# Patient Record
Sex: Female | Born: 1955 | Race: White | Hispanic: No | Marital: Married
Health system: Midwestern US, Community
[De-identification: ages and names within clinical notes are randomized; demographics above are authoritative.]

## PROBLEM LIST (undated history)

## (undated) DIAGNOSIS — G4730 Sleep apnea, unspecified: Secondary | ICD-10-CM

## (undated) DIAGNOSIS — E785 Hyperlipidemia, unspecified: Secondary | ICD-10-CM

## (undated) HISTORY — PX: POLYPECTOMY: SHX149

## (undated) HISTORY — PX: WISDOM TOOTH EXTRACTION: SHX21

## (undated) HISTORY — PX: COLONOSCOPY: SHX174

## (undated) HISTORY — DX: Hyperlipidemia, unspecified: E78.5

## (undated) HISTORY — DX: Sleep apnea, unspecified: G47.30

## (undated) NOTE — Progress Notes (Signed)
 Formatting of this note is different from the original. Subjective:    Patient ID: Teresa Huff is a 33 y.o. female.  HPI [name] presents with sore throat, chills and sweats, for 2 days.  Minimal nasal drip, though no sinus pain or cough noted.  No myalgias.  Not much fatigue.  No rash.  Review of Systems   GI neg Gu neg   Objective:   Physical Exam Pleasant WF, alert, in NAD Nostrils mildly red, moist No sinus tenderness Throat red, no pus Minimal ant cervical LN's Lungs clear RRR No rash  Assessment:   Pharyngitis  Plan:    Strep screen negative Antibiotic, ibuprofen    The history section was last reviewed by Teresa DELENA Salmon, MD on Sep 04, 2011. Electronically signed by Teresa DELENA Salmon, MD at 09/04/2011  3:20 PM EDT

## (undated) NOTE — Progress Notes (Signed)
 Formatting of this note is different from the original. Subjective:    Patient ID: Teresa Huff is a 89 y.o. female.  HPI  Awoke mattery red left eye. Husband had pink eye. On meds and better. Had matter. Irritated. No pain.  Mild watery. No contacts. Nothing gotten in eye.  No cold. Ampicillin last week for throat and is better.  No medical problems.  No allergies to meds.   Review of Systems  All other systems reviewed and are negative.     Objective:   Physical Exam  Constitutional: She appears well-developed and well-nourished.  HENT:  Head: Normocephalic and atraumatic.  Right Ear: External ear normal.  Left Ear: External ear normal.  Nose: Nose normal.       Mild residual pharyngitis  Eyes: Right eye exhibits no discharge. Left eye exhibits discharge.       Cornea normal. Mild conjunctivitis.  Neck: Normal range of motion. Neck supple.  Cardiovascular: Normal rate, regular rhythm and normal heart sounds.   Pulmonary/Chest: Effort normal and breath sounds normal.    Assessment:    conjunctivitis   Plan:   Sodium sulamyd opth   The history section was last reviewed by Toribio FORBES Ada, MA on Sep 07, 2011. Electronically signed by Particia Capri, MD at 09/07/2011  9:03 AM EDT

---

## 1999-05-23 ENCOUNTER — Encounter: Admission: RE | Admit: 1999-05-23 | Discharge: 1999-05-23 | Payer: Self-pay | Admitting: Internal Medicine

## 1999-05-23 ENCOUNTER — Encounter: Payer: Self-pay | Admitting: Internal Medicine

## 1999-06-02 ENCOUNTER — Encounter: Payer: Self-pay | Admitting: Internal Medicine

## 1999-06-02 ENCOUNTER — Encounter: Admission: RE | Admit: 1999-06-02 | Discharge: 1999-06-02 | Payer: Self-pay | Admitting: Internal Medicine

## 2000-07-13 ENCOUNTER — Encounter: Payer: Self-pay | Admitting: Internal Medicine

## 2000-07-13 ENCOUNTER — Encounter: Admission: RE | Admit: 2000-07-13 | Discharge: 2000-07-13 | Payer: Self-pay | Admitting: Internal Medicine

## 2001-05-27 ENCOUNTER — Encounter: Payer: Self-pay | Admitting: Internal Medicine

## 2001-05-27 ENCOUNTER — Encounter: Admission: RE | Admit: 2001-05-27 | Discharge: 2001-05-27 | Payer: Self-pay | Admitting: Internal Medicine

## 2001-07-14 ENCOUNTER — Encounter: Payer: Self-pay | Admitting: Internal Medicine

## 2001-07-14 ENCOUNTER — Encounter: Admission: RE | Admit: 2001-07-14 | Discharge: 2001-07-14 | Payer: Self-pay | Admitting: Internal Medicine

## 2002-07-17 ENCOUNTER — Encounter: Admission: RE | Admit: 2002-07-17 | Discharge: 2002-07-17 | Payer: Self-pay | Admitting: Internal Medicine

## 2002-07-17 ENCOUNTER — Encounter: Payer: Self-pay | Admitting: Internal Medicine

## 2003-07-20 ENCOUNTER — Encounter: Admission: RE | Admit: 2003-07-20 | Discharge: 2003-07-20 | Payer: Self-pay | Admitting: Internal Medicine

## 2004-10-01 ENCOUNTER — Encounter: Admission: RE | Admit: 2004-10-01 | Discharge: 2004-10-01 | Payer: Self-pay | Admitting: Internal Medicine

## 2005-05-18 ENCOUNTER — Encounter: Admission: RE | Admit: 2005-05-18 | Discharge: 2005-05-18 | Payer: Self-pay | Admitting: Internal Medicine

## 2005-10-06 ENCOUNTER — Encounter: Admission: RE | Admit: 2005-10-06 | Discharge: 2005-10-06 | Payer: Self-pay | Admitting: Internal Medicine

## 2006-05-17 ENCOUNTER — Encounter: Admission: RE | Admit: 2006-05-17 | Discharge: 2006-05-17 | Payer: Self-pay | Admitting: Internal Medicine

## 2006-10-11 ENCOUNTER — Encounter: Admission: RE | Admit: 2006-10-11 | Discharge: 2006-10-11 | Payer: Self-pay | Admitting: Internal Medicine

## 2007-08-16 ENCOUNTER — Ambulatory Visit: Payer: Self-pay | Admitting: Internal Medicine

## 2007-08-22 ENCOUNTER — Telehealth (INDEPENDENT_AMBULATORY_CARE_PROVIDER_SITE_OTHER): Payer: Self-pay | Admitting: *Deleted

## 2007-08-23 ENCOUNTER — Ambulatory Visit: Payer: Self-pay | Admitting: Internal Medicine

## 2007-10-17 ENCOUNTER — Encounter: Admission: RE | Admit: 2007-10-17 | Discharge: 2007-10-17 | Payer: Self-pay | Admitting: Internal Medicine

## 2008-10-17 ENCOUNTER — Encounter: Admission: RE | Admit: 2008-10-17 | Discharge: 2008-10-17 | Payer: Self-pay | Admitting: Internal Medicine

## 2009-10-23 ENCOUNTER — Encounter: Admission: RE | Admit: 2009-10-23 | Discharge: 2009-10-23 | Payer: Self-pay | Admitting: Internal Medicine

## 2010-10-23 ENCOUNTER — Other Ambulatory Visit: Payer: Self-pay | Admitting: Internal Medicine

## 2010-10-23 DIAGNOSIS — Z1231 Encounter for screening mammogram for malignant neoplasm of breast: Secondary | ICD-10-CM

## 2010-11-05 ENCOUNTER — Ambulatory Visit: Payer: Self-pay

## 2010-11-06 ENCOUNTER — Ambulatory Visit: Payer: Self-pay

## 2010-11-11 ENCOUNTER — Ambulatory Visit
Admission: RE | Admit: 2010-11-11 | Discharge: 2010-11-11 | Disposition: A | Discharge status: Home / Self Care | Payer: BC Managed Care – PPO | Source: Ambulatory Visit | Attending: Internal Medicine | Admitting: Internal Medicine

## 2010-11-11 DIAGNOSIS — Z1231 Encounter for screening mammogram for malignant neoplasm of breast: Secondary | ICD-10-CM

## 2010-11-14 ENCOUNTER — Other Ambulatory Visit: Payer: Self-pay | Admitting: Internal Medicine

## 2010-11-14 DIAGNOSIS — R928 Other abnormal and inconclusive findings on diagnostic imaging of breast: Secondary | ICD-10-CM

## 2010-11-20 ENCOUNTER — Ambulatory Visit
Admission: RE | Admit: 2010-11-20 | Discharge: 2010-11-20 | Disposition: A | Discharge status: Home / Self Care | Payer: BC Managed Care – PPO | Source: Ambulatory Visit | Attending: Internal Medicine | Admitting: Internal Medicine

## 2010-11-20 DIAGNOSIS — R928 Other abnormal and inconclusive findings on diagnostic imaging of breast: Secondary | ICD-10-CM

## 2010-11-26 ENCOUNTER — Other Ambulatory Visit: Payer: BC Managed Care – PPO

## 2011-12-07 ENCOUNTER — Other Ambulatory Visit: Payer: Self-pay | Admitting: Internal Medicine

## 2011-12-07 DIAGNOSIS — Z1231 Encounter for screening mammogram for malignant neoplasm of breast: Secondary | ICD-10-CM

## 2011-12-14 ENCOUNTER — Ambulatory Visit
Admission: RE | Admit: 2011-12-14 | Discharge: 2011-12-14 | Disposition: A | Discharge status: Home / Self Care | Payer: BC Managed Care – PPO | Source: Ambulatory Visit | Attending: Internal Medicine | Admitting: Internal Medicine

## 2011-12-14 DIAGNOSIS — Z1231 Encounter for screening mammogram for malignant neoplasm of breast: Secondary | ICD-10-CM

## 2012-07-02 IMAGING — MG MM DIGITAL DIAG LTD L {BCG}
2 series · 2 of 2 positions shown · non-contrast
Comparison: Recent and prior mammograms from the [REDACTED]

CLINICAL DATA: Abnormal screening mammogram with possible left
breast distortion.

DIGITAL DIAGNOSTIC LEFT MAMMOGRAM

[L CC]
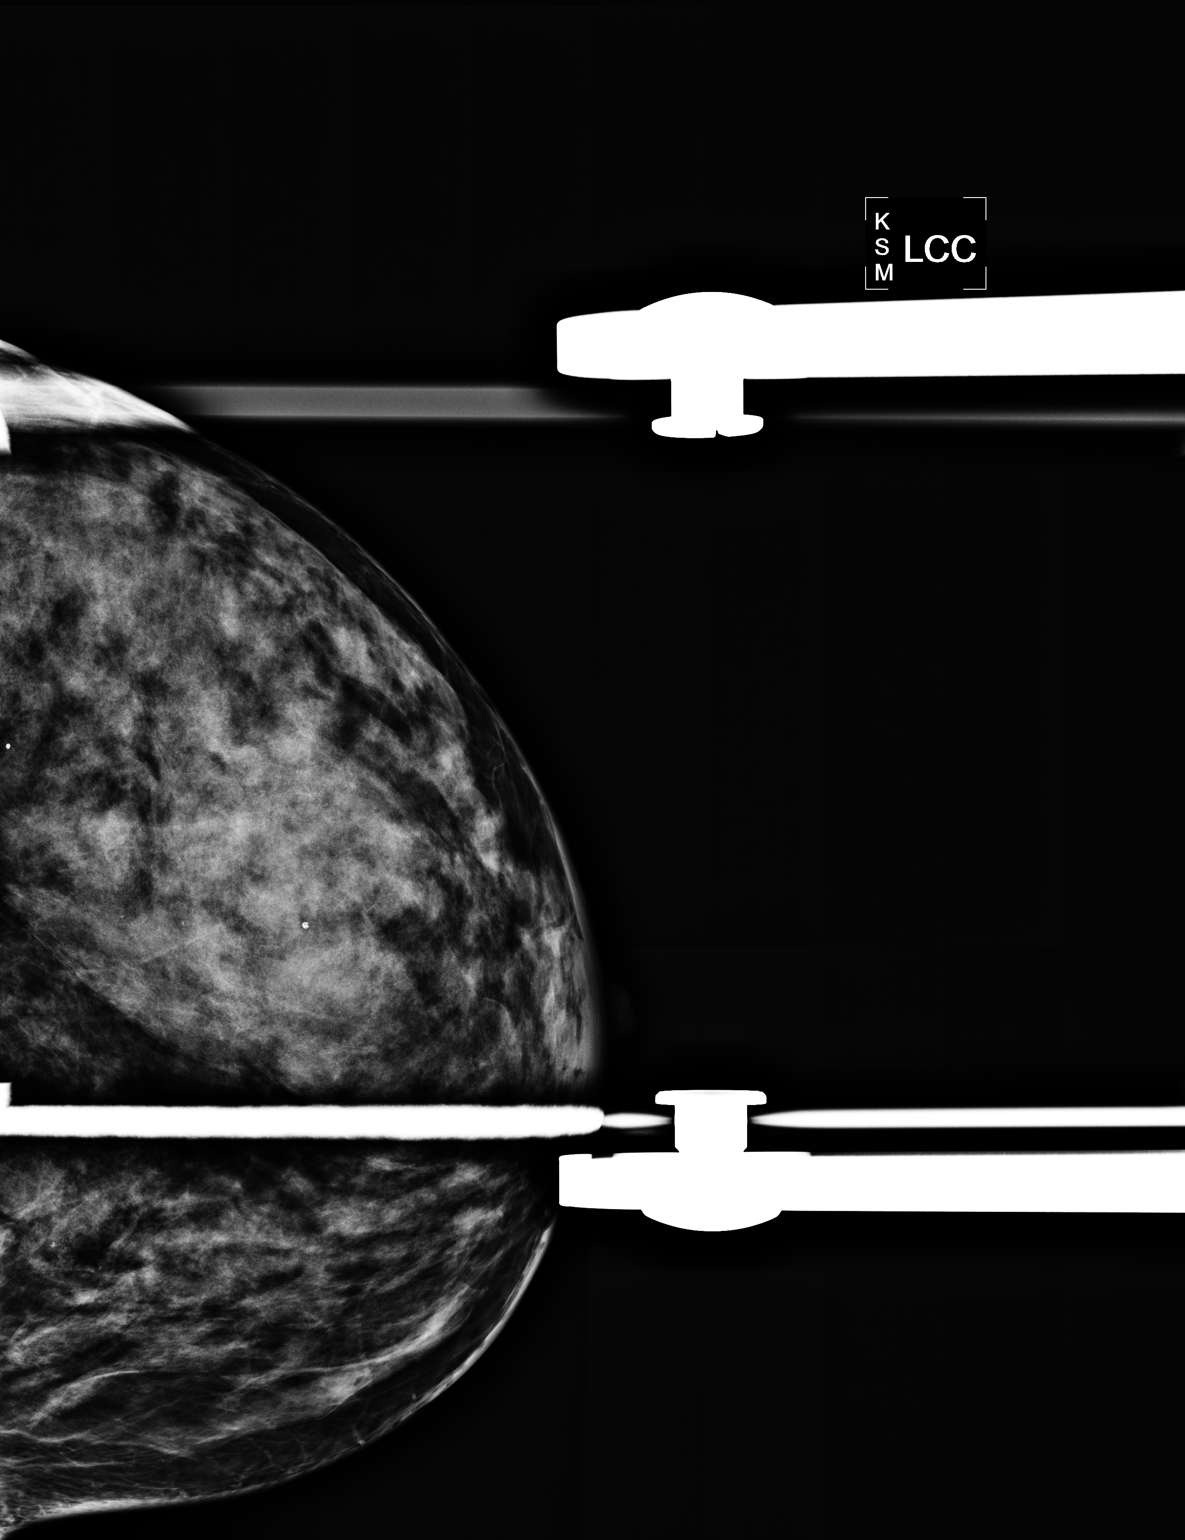

[L MLO]
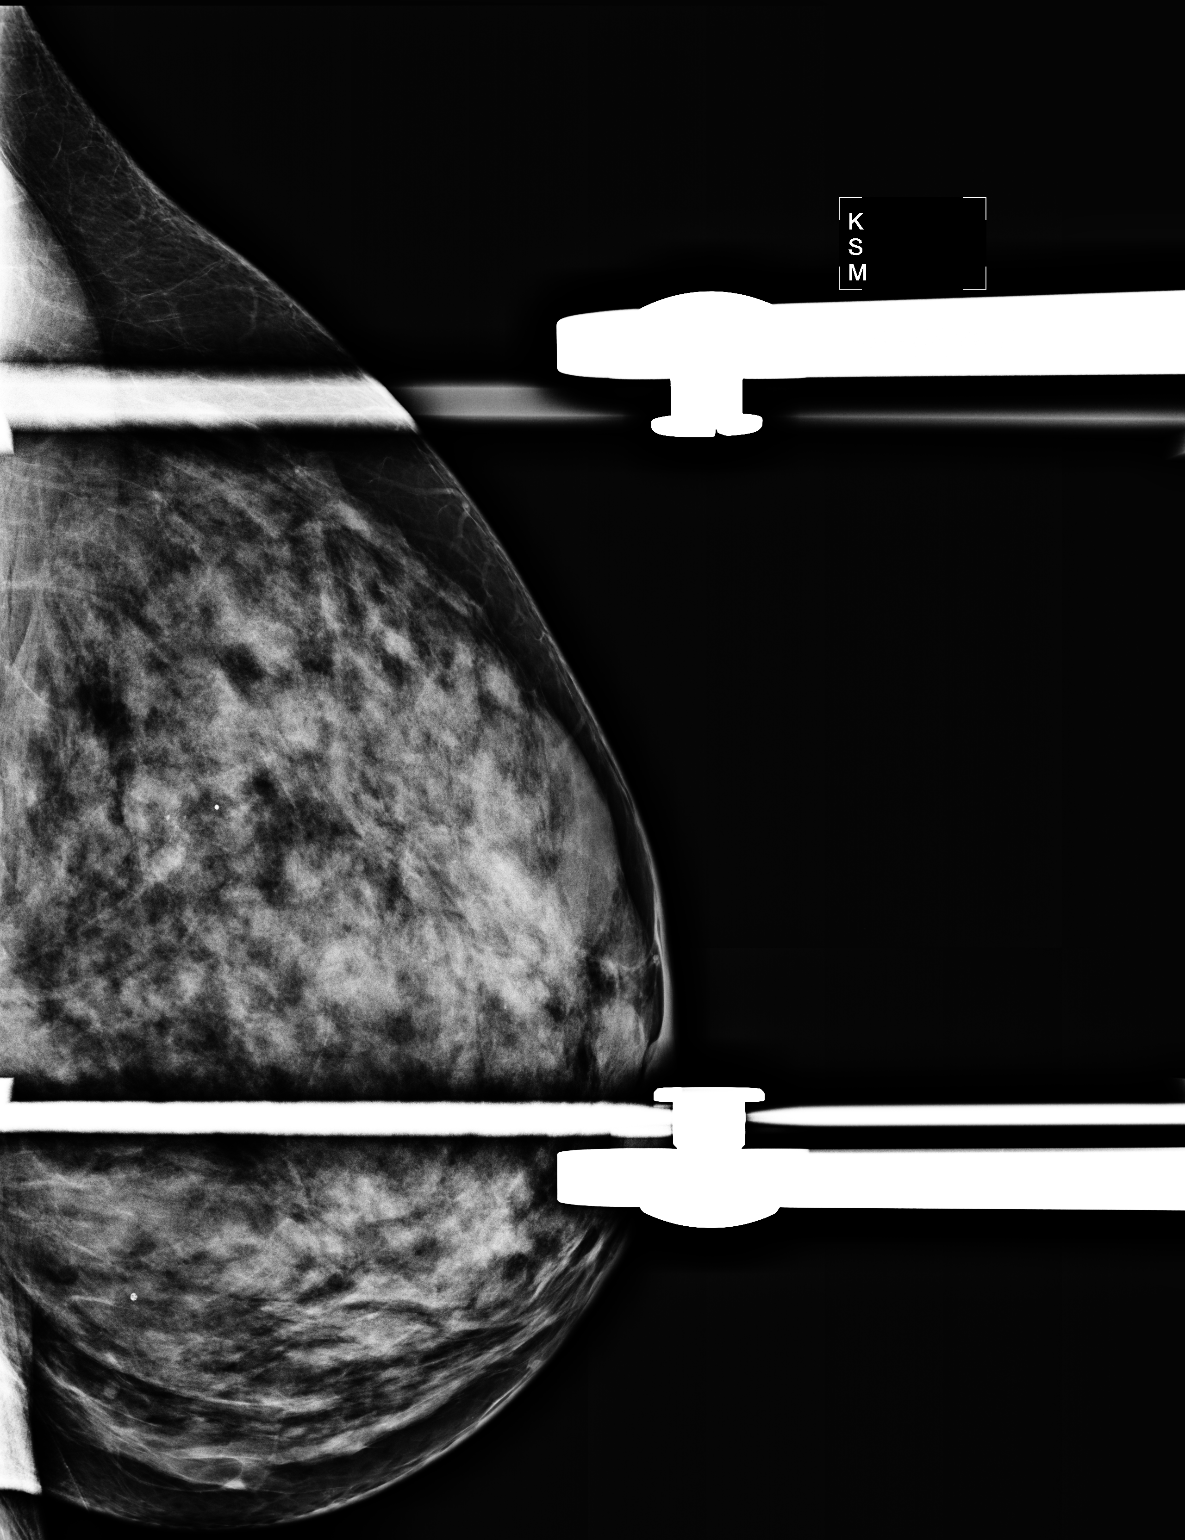

[2 of 2 positions shown; findings below may reference images not displayed]

FINDINGS: CC and MLO spot compression views of the left breast are
performed in the area of the screening study finding. There is no
evidence of persistent mass, distortion, or suspicious
calcifications.
IMPRESSION: No mammographic evidence of malignancy or abnormality, specifically
in the area of the screening study finding.

These findings were discussed with the patient.  She was encouraged
to begin/continue monthly self exams and to contact her primary
physician if any changes noted.

BI-RADS CATEGORY 1:  Negative.

Recommend bilateral screening mammograms in 1 year.

## 2012-10-05 ENCOUNTER — Encounter: Payer: Self-pay | Admitting: Internal Medicine

## 2012-10-17 ENCOUNTER — Encounter: Payer: Self-pay | Admitting: Internal Medicine

## 2012-11-16 ENCOUNTER — Other Ambulatory Visit: Payer: Self-pay

## 2012-11-16 DIAGNOSIS — Z1231 Encounter for screening mammogram for malignant neoplasm of breast: Secondary | ICD-10-CM

## 2012-12-14 ENCOUNTER — Ambulatory Visit
Admission: RE | Admit: 2012-12-14 | Discharge: 2012-12-14 | Disposition: A | Discharge status: Home / Self Care | Payer: BC Managed Care – PPO | Source: Ambulatory Visit

## 2012-12-14 DIAGNOSIS — Z1231 Encounter for screening mammogram for malignant neoplasm of breast: Secondary | ICD-10-CM

## 2012-12-27 ENCOUNTER — Ambulatory Visit (AMBULATORY_SURGERY_CENTER): Payer: Self-pay

## 2012-12-27 VITALS — Ht 66.0 in | Wt 132.2 lb

## 2012-12-27 DIAGNOSIS — Z800 Family history of malignant neoplasm of digestive organs: Secondary | ICD-10-CM

## 2012-12-27 DIAGNOSIS — Z860100 Personal history of colon polyps, unspecified: Secondary | ICD-10-CM

## 2012-12-27 MED ORDER — MOVIPREP 100 G PO SOLR: ORAL | Status: DC

## 2012-12-28 ENCOUNTER — Encounter: Payer: Self-pay | Admitting: Internal Medicine

## 2013-01-10 ENCOUNTER — Encounter: Payer: Self-pay | Admitting: Internal Medicine

## 2013-01-10 ENCOUNTER — Ambulatory Visit (AMBULATORY_SURGERY_CENTER): Payer: BC Managed Care – PPO | Admitting: Internal Medicine

## 2013-01-10 VITALS — BP 132/79 | HR 55 | Temp 97.3°F | Resp 27 | Ht 66.0 in | Wt 132.0 lb

## 2013-01-10 DIAGNOSIS — Z860100 Personal history of colon polyps, unspecified: Secondary | ICD-10-CM

## 2013-01-10 DIAGNOSIS — Z1211 Encounter for screening for malignant neoplasm of colon: Secondary | ICD-10-CM

## 2013-01-10 DIAGNOSIS — Z800 Family history of malignant neoplasm of digestive organs: Secondary | ICD-10-CM

## 2013-01-10 MED ORDER — SODIUM CHLORIDE 0.9 % IV SOLN: 500.0000 mL | INTRAVENOUS | Status: DC

## 2013-01-10 NOTE — Progress Notes (Signed)
Patient did not experience any of the following events: a burn prior to discharge; a fall within the facility; wrong site/side/patient/procedure/implant event; or a hospital transfer or hospital admission upon discharge from the facility. (G8907) Patient did not have preoperative order for IV antibiotic SSI prophylaxis. (G8918)  

## 2013-01-10 NOTE — Progress Notes (Signed)
A/ox3 pleased with MAC, report to Jane RN 

## 2013-01-10 NOTE — Patient Instructions (Signed)

## 2013-01-10 NOTE — Progress Notes (Signed)
NO EGG OR SOY ALLERGY. EWM 

## 2013-01-10 NOTE — Op Note (Signed)
[facility] [address] [place], [address]   COLONOSCOPY PROCEDURE REPORT  PATIENT: Teresa Huff, Teresa Huff  MR#: [id number] BIRTHDATE: 1955/06/21 , 56  yrs. old GENDER: Female ENDOSCOPIST: Roxy Cedar, MD REFERRED [id number] Program Recall PROCEDURE DATE:  01/10/2013 PROCEDURE:   Colonoscopy, surveillance First Screening Colonoscopy - Avg.  risk and is 50 yrs.  old or older - No.  Prior Negative Screening - Now for repeat screening. N/A  History of Adenoma - Now for follow-up colonoscopy & has been > or = to 3 yrs.  Yes hx of adenoma.  Has been 3 or more years since last colonoscopy.  Polyps Removed Today? No.  Recommend repeat exam, <10 yrs? No. ASA CLASS:   Class II INDICATIONS:elevated risk screening, Patient's family history of colon cancer (GP), and Patient's personal history of adenomatous colon polyps.   Prior exams 1992,95,99,2004,09. No advanced neoplasia MEDICATIONS: MAC sedation, administered by CRNA and propofol (Diprivan) 340mg  IV  DESCRIPTION OF PROCEDURE:   After the risks benefits and alternatives of the procedure were thoroughly explained, informed consent was obtained.  A digital rectal exam revealed no abnormalities of the rectum.   The LB [id number]  endoscope was introduced through the anus and advanced to the cecum, which was identified by both the appendix and ileocecal valve. No adverse events experienced.   The quality of the prep was excellent, using MoviPrep  The instrument was then slowly withdrawn as the colon was fully examined.  COLON FINDINGS: Moderate diverticulosis was noted in the sigmoid colon.   A normal appearing cecum, ileocecal valve, and appendiceal orifice were identified.  The ascending, hepatic flexure, transverse, splenic flexure, descending, sigmoid colon and rectum appeared unremarkable.  No polyps or cancers were seen. Retroflexed views revealed no abnormalities. The time to cecum=4 minutes 49  seconds.  Withdrawal time=13 minutes 37 seconds.  The scope was withdrawn and the procedure completed. COMPLICATIONS: There were no complications.  ENDOSCOPIC IMPRESSION: 1.   Moderate diverticulosis was noted in the sigmoid colon 2.   Otherwise Normal colon  RECOMMENDATIONS: 1. Continue current colorectal screening/surveillance recommendations with a repeat colonoscopy in 10 years.   eSigned:  Roxy Cedar, MD 01/10/2013 9:17 AM   cc: Burton Apley, MD and The Patient

## 2013-01-11 ENCOUNTER — Telehealth: Payer: Self-pay

## 2013-01-11 NOTE — Telephone Encounter (Signed)
  Follow up Call-  Call back number 01/10/2013  Post procedure Call Back phone  # [phone]  Permission to leave phone message Yes     Patient questions:  Do you have a fever, pain , or abdominal swelling? no Pain Score  0 *  Have you tolerated food without any problems? yes  Have you been able to return to your normal activities? yes  Do you have any questions about your discharge instructions: Diet   no Medications  no Follow up visit  no  Do you have questions or concerns about your Care? no  Actions: * If pain score is 4 or above: No action needed, pain <4.

## 2013-11-16 ENCOUNTER — Other Ambulatory Visit: Payer: Self-pay | Admitting: Internal Medicine

## 2013-11-16 ENCOUNTER — Other Ambulatory Visit: Payer: Self-pay

## 2013-11-16 DIAGNOSIS — Z1231 Encounter for screening mammogram for malignant neoplasm of breast: Secondary | ICD-10-CM

## 2013-11-16 DIAGNOSIS — Z780 Asymptomatic menopausal state: Secondary | ICD-10-CM

## 2013-11-16 DIAGNOSIS — E2839 Other primary ovarian failure: Secondary | ICD-10-CM

## 2013-12-27 ENCOUNTER — Ambulatory Visit
Admission: RE | Admit: 2013-12-27 | Discharge: 2013-12-27 | Disposition: A | Discharge status: Home / Self Care | Payer: BC Managed Care – PPO | Source: Ambulatory Visit | Attending: Internal Medicine | Admitting: Internal Medicine

## 2013-12-27 ENCOUNTER — Ambulatory Visit
Admission: RE | Admit: 2013-12-27 | Discharge: 2013-12-27 | Disposition: A | Discharge status: Home / Self Care | Payer: BC Managed Care – PPO | Source: Ambulatory Visit

## 2013-12-27 DIAGNOSIS — Z780 Asymptomatic menopausal state: Secondary | ICD-10-CM

## 2013-12-27 DIAGNOSIS — Z1231 Encounter for screening mammogram for malignant neoplasm of breast: Secondary | ICD-10-CM

## 2013-12-27 DIAGNOSIS — E2839 Other primary ovarian failure: Secondary | ICD-10-CM

## 2014-01-04 ENCOUNTER — Encounter: Payer: Self-pay | Admitting: Internal Medicine

## 2014-11-27 ENCOUNTER — Other Ambulatory Visit: Payer: Self-pay

## 2014-11-27 DIAGNOSIS — Z1231 Encounter for screening mammogram for malignant neoplasm of breast: Secondary | ICD-10-CM

## 2015-01-01 ENCOUNTER — Ambulatory Visit
Admission: RE | Admit: 2015-01-01 | Discharge: 2015-01-01 | Disposition: A | Discharge status: Home / Self Care | Payer: PRIVATE HEALTH INSURANCE | Source: Ambulatory Visit

## 2015-01-01 DIAGNOSIS — Z1231 Encounter for screening mammogram for malignant neoplasm of breast: Secondary | ICD-10-CM

## 2015-06-25 ENCOUNTER — Ambulatory Visit
Admission: RE | Admit: 2015-06-25 | Discharge: 2015-06-25 | Disposition: A | Discharge status: Home / Self Care | Payer: PRIVATE HEALTH INSURANCE | Source: Ambulatory Visit | Attending: Internal Medicine | Admitting: Internal Medicine

## 2015-06-25 ENCOUNTER — Other Ambulatory Visit: Payer: Self-pay | Admitting: Internal Medicine

## 2015-06-25 DIAGNOSIS — R05 Cough: Secondary | ICD-10-CM

## 2015-06-25 DIAGNOSIS — R059 Cough, unspecified: Secondary | ICD-10-CM

## 2015-11-26 ENCOUNTER — Other Ambulatory Visit: Payer: Self-pay | Admitting: Internal Medicine

## 2015-11-26 DIAGNOSIS — Z1231 Encounter for screening mammogram for malignant neoplasm of breast: Secondary | ICD-10-CM

## 2016-01-02 ENCOUNTER — Ambulatory Visit
Admission: RE | Admit: 2016-01-02 | Discharge: 2016-01-02 | Disposition: A | Discharge status: Home / Self Care | Payer: 59 | Source: Ambulatory Visit | Attending: Internal Medicine | Admitting: Internal Medicine

## 2016-01-02 DIAGNOSIS — Z1231 Encounter for screening mammogram for malignant neoplasm of breast: Secondary | ICD-10-CM

## 2016-11-26 ENCOUNTER — Other Ambulatory Visit: Payer: Self-pay | Admitting: Internal Medicine

## 2016-11-26 DIAGNOSIS — Z1231 Encounter for screening mammogram for malignant neoplasm of breast: Secondary | ICD-10-CM

## 2017-01-06 ENCOUNTER — Ambulatory Visit
Admission: RE | Admit: 2017-01-06 | Discharge: 2017-01-06 | Disposition: A | Discharge status: Home / Self Care | Payer: 59 | Source: Ambulatory Visit | Attending: Internal Medicine | Admitting: Internal Medicine

## 2017-01-06 DIAGNOSIS — Z1231 Encounter for screening mammogram for malignant neoplasm of breast: Secondary | ICD-10-CM

## 2017-05-29 ENCOUNTER — Ambulatory Visit: Payer: Self-pay | Admitting: Emergency Medicine

## 2017-05-29 VITALS — BP 124/78 | HR 82 | Temp 98.6°F | Resp 18

## 2017-05-29 DIAGNOSIS — J40 Bronchitis, not specified as acute or chronic: Secondary | ICD-10-CM

## 2017-05-29 MED ORDER — BENZONATATE 100 MG PO CAPS: 100.0000 mg | ORAL_CAPSULE | Freq: Three times a day (TID) | ORAL | 0 refills | Status: DC | PRN

## 2017-05-29 MED ORDER — AZITHROMYCIN 250 MG PO TABS
ORAL_TABLET | ORAL | 0 refills | Status: DC
Start: 2017-05-29 — End: 2023-03-03

## 2017-05-29 NOTE — Progress Notes (Signed)
  Patient presents for evaluation of nonproductive cough. Symptoms began 7 days ago and are gradually worsening since that time. Past history is significant for nothing. Denies congestion, fever, myalgias, sore throat, or other symptoms. Does not smoke, not currently taking any OTC medications.  Review of Systems  Constitutional: Negative for chills, fever and malaise/fatigue.  HENT: Negative.   Respiratory: Positive for cough. Negative for shortness of breath and wheezing.   Cardiovascular: Negative.   Gastrointestinal: Negative.   Musculoskeletal: Negative.   Skin: Negative.   Neurological: Negative.    O: Vitals:   05/29/17 1343  BP: 124/78  Pulse: 82  Resp: 18  Temp: 98.6 F (37 C)  SpO2: 97%    Physical Exam  Constitutional: She appears well-developed and well-nourished. No distress.  HENT:  Head: Normocephalic and atraumatic.  Right Ear: Tympanic membrane and external ear normal.  Left Ear: Tympanic membrane and external ear normal.  Nose: Nose normal. Right sinus exhibits no maxillary sinus tenderness and no frontal sinus tenderness. Left sinus exhibits no maxillary sinus tenderness and no frontal sinus tenderness.  Mouth/Throat: Uvula is midline.  Eyes: Conjunctivae are normal.  Cardiovascular: Normal rate and regular rhythm.  Pulmonary/Chest: Effort normal and breath sounds normal.  Lymphadenopathy:    She has no cervical adenopathy.  Neurological: She is alert.  Skin: Skin is warm and dry. Capillary refill takes less than 2 seconds. No rash noted. She is not diaphoretic. No erythema.  Psychiatric: She has a normal mood and affect. Her behavior is normal.  Nursing note and vitals reviewed.  A: 1. Bronchitis    P: 1. Bronchitis -Rest, OTC mucinex DM, follow up with PCP as needed - azithromycin (ZITHROMAX) 250 MG tablet; 2 tablets today, then 1 daily till finished  Dispense: 6 tablet; Refill: 0 - benzonatate (TESSALON PERLES) 100 MG capsule; Take 1 capsule (100  mg total) by mouth 3 (three) times daily as needed.  Dispense: 30 capsule; Refill: 0

## 2017-05-29 NOTE — Patient Instructions (Signed)

## 2017-06-01 ENCOUNTER — Telehealth: Payer: Self-pay

## 2017-06-01 NOTE — Telephone Encounter (Signed)
Called and spoke with pt to see how she was feeling and she states she is feeling much better.

## 2017-12-17 ENCOUNTER — Other Ambulatory Visit: Payer: Self-pay | Admitting: Internal Medicine

## 2017-12-17 DIAGNOSIS — Z1231 Encounter for screening mammogram for malignant neoplasm of breast: Secondary | ICD-10-CM

## 2018-01-05 ENCOUNTER — Ambulatory Visit: Payer: Self-pay | Admitting: Family Medicine

## 2018-01-05 ENCOUNTER — Encounter: Payer: Self-pay | Admitting: Family Medicine

## 2018-01-05 VITALS — BP 110/82 | HR 77 | Temp 98.9°F | Wt 135.4 lb

## 2018-01-05 DIAGNOSIS — R059 Cough, unspecified: Secondary | ICD-10-CM

## 2018-01-05 DIAGNOSIS — R05 Cough: Secondary | ICD-10-CM

## 2018-01-05 DIAGNOSIS — R062 Wheezing: Secondary | ICD-10-CM

## 2018-01-05 DIAGNOSIS — J069 Acute upper respiratory infection, unspecified: Secondary | ICD-10-CM

## 2018-01-05 MED ORDER — BENZONATATE 200 MG PO CAPS: 200.0000 mg | ORAL_CAPSULE | Freq: Three times a day (TID) | ORAL | 0 refills | Status: DC | PRN

## 2018-01-05 MED ORDER — AMOXICILLIN 875 MG PO TABS: 875.0000 mg | ORAL_TABLET | Freq: Two times a day (BID) | ORAL | 0 refills | Status: AC

## 2018-01-05 NOTE — Progress Notes (Signed)
Teresa Huff is a 62 y.o. female who presents today with concerns of cough for the last 7 days. She denies any sick contacts social or work related and reports that she has only taken Advil for her symptoms without much relief, she denies a known fever or chills but did not take her temperature. She denies any chronic health conditions or any regular daily medication use. She describes herself as generally healthy with occasional seasonal allergies that she will treat as needed with Allegra. She denies a history of GERD or Asthma.  Review of Systems  Constitutional: Negative for chills, fever and malaise/fatigue.  HENT: Positive for sinus pain. Negative for congestion, ear discharge, ear pain and sore throat.   Eyes: Negative.   Respiratory: Positive for cough. Negative for sputum production and shortness of breath.   Cardiovascular: Negative.  Negative for chest pain.  Gastrointestinal: Negative for abdominal pain, diarrhea, nausea and vomiting.  Genitourinary: Negative for dysuria, frequency, hematuria and urgency.  Musculoskeletal: Negative for myalgias.  Skin: Negative.   Neurological: Negative for headaches.  Endo/Heme/Allergies: Negative.   Psychiatric/Behavioral: Negative.     O: Vitals:   01/05/18 0826  BP: 110/82  Pulse: 77  Temp: 98.9 F (37.2 C)  SpO2: 95%     Physical Exam  Constitutional: She is oriented to person, place, and time. Vital signs are normal. She appears well-developed and well-nourished. She is active.  Non-toxic appearance. She does not have a sickly appearance.  HENT:  Head: Normocephalic.  Right Ear: Hearing, external ear and ear canal normal. A middle ear effusion is present.  Left Ear: Hearing, tympanic membrane, external ear and ear canal normal.  Nose: Rhinorrhea present. Right sinus exhibits no maxillary sinus tenderness and no frontal sinus tenderness. Left sinus exhibits no maxillary sinus tenderness and no frontal sinus tenderness.   Mouth/Throat: Uvula is midline and oropharynx is clear and moist.  Neck: Normal range of motion. Neck supple.  Cardiovascular: Normal rate, regular rhythm, normal heart sounds and normal pulses.  Pulmonary/Chest: Effort normal. She has wheezes in the right upper field. She has rhonchi in the right upper field, the right middle field, the right lower field, the left upper field, the left middle field and the left lower field.  Abdominal: Soft. Bowel sounds are normal.  Musculoskeletal: Normal range of motion.  Lymphadenopathy:       Head (right side): Tonsillar adenopathy present. No submental and no submandibular adenopathy present.       Head (left side): Tonsillar adenopathy present. No submental and no submandibular adenopathy present.    She has cervical adenopathy.  Neurological: She is alert and oriented to person, place, and time.  Psychiatric: She has a normal mood and affect.  Vitals reviewed.    A: 1. Cough   2. Wheezing   3. Upper respiratory tract infection, unspecified type      P: Discussed exam findings, diagnosis etiology and medication use and indications reviewed with patient. Follow- Up and discharge instructions provided. No emergent/urgent issues found on exam.  Patient verbalized understanding of information provided and agrees with plan of care (POC), all questions answered.  Selected medication that would treat UTI and sinusitis like symptoms- noting patient's allergies to UTI drug of choice Macrobid- due to suspected local Septra resistance.  PLAN< Over the counter Mucinex-D Increasing hydration and good cough hygiene Take medication prescribed as directed  1. Upper respiratory tract infection, unspecified type - amoxicillin (AMOXIL) 875 MG tablet; Take 1 tablet (875 mg total)  by mouth 2 (two) times daily for 10 days.  2. Cough - benzonatate (TESSALON) 200 MG capsule; Take 1 capsule (200 mg total) by mouth 3 (three) times daily as needed.  3.  Wheezing Mild- will treat and continue to monitor

## 2018-01-05 NOTE — Patient Instructions (Addendum)
PLAN< Over the counter Mucinex D Increasing hydration and good cough hygiene Take medication prescribed as directed   Upper Respiratory Infection, Adult Most upper respiratory infections (URIs) are caused by a virus. A URI affects the nose, throat, and upper air passages. The most common type of URI is often called "the common cold." Follow these instructions at home:  Take medicines only as told by your doctor.  Gargle warm saltwater or take cough drops to comfort your throat as told by your doctor.  Use a warm mist humidifier or inhale steam from a shower to increase air moisture. This may make it easier to breathe.  Drink enough fluid to keep your pee (urine) clear or pale yellow.  Eat soups and other clear broths.  Have a healthy diet.  Rest as needed.  Go back to work when your fever is gone or your doctor says it is okay. ? You may need to stay home longer to avoid giving your URI to others. ? You can also wear a face mask and wash your hands often to prevent spread of the virus.  Use your inhaler more if you have asthma.  Do not use any tobacco products, including cigarettes, chewing tobacco, or electronic cigarettes. If you need help quitting, ask your doctor. Contact a doctor if:  You are getting worse, not better.  Your symptoms are not helped by medicine.  You have chills.  You are getting more short of breath.  You have brown or red mucus.  You have yellow or brown discharge from your nose.  You have pain in your face, especially when you bend forward.  You have a fever.  You have puffy (swollen) neck glands.  You have pain while swallowing.  You have white areas in the back of your throat. Get help right away if:  You have very bad or constant: ? Headache. ? Ear pain. ? Pain in your forehead, behind your eyes, and over your cheekbones (sinus pain). ? Chest pain.  You have long-lasting (chronic) lung disease and any of the  following: ? Wheezing. ? Long-lasting cough. ? Coughing up blood. ? A change in your usual mucus.  You have a stiff neck.  You have changes in your: ? Vision. ? Hearing. ? Thinking. ? Mood. This information is not intended to replace advice given to you by your health care provider. Make sure you discuss any questions you have with your health care provider. Document Released: 08/26/2007 Document Revised: 11/10/2015 Document Reviewed: 06/14/2013 Elsevier Interactive Patient Education  2018 [employer].

## 2018-01-24 ENCOUNTER — Ambulatory Visit
Admission: RE | Admit: 2018-01-24 | Discharge: 2018-01-24 | Disposition: A | Discharge status: Home / Self Care | Payer: 59 | Source: Ambulatory Visit | Attending: Internal Medicine | Admitting: Internal Medicine

## 2018-01-24 DIAGNOSIS — Z1231 Encounter for screening mammogram for malignant neoplasm of breast: Secondary | ICD-10-CM

## 2018-01-25 ENCOUNTER — Other Ambulatory Visit: Payer: Self-pay | Admitting: Internal Medicine

## 2018-01-25 ENCOUNTER — Ambulatory Visit
Admission: RE | Admit: 2018-01-25 | Discharge: 2018-01-25 | Disposition: A | Discharge status: Home / Self Care | Payer: 59 | Source: Ambulatory Visit | Attending: Internal Medicine | Admitting: Internal Medicine

## 2018-01-25 DIAGNOSIS — J189 Pneumonia, unspecified organism: Secondary | ICD-10-CM

## 2018-07-15 ENCOUNTER — Inpatient Hospital Stay
Admit: 2018-07-15 | Discharge: 2018-07-15 | Disposition: A | Discharge status: Home / Self Care | Payer: PRIVATE HEALTH INSURANCE | Attending: Emergency Medicine

## 2018-07-15 DIAGNOSIS — R42 Dizziness and giddiness: Secondary | ICD-10-CM

## 2018-07-15 LAB — METABOLIC PANEL, COMPREHENSIVE
A-G Ratio: 1.3 (ref 0.8–1.7)
ALT (SGPT): 33 U/L (ref 13–56)
AST (SGOT): 21 U/L (ref 10–38)
Albumin: 4.1 g/dL (ref 3.4–5.0)
Alk. phosphatase: 45 U/L (ref 45–117)
Anion gap: 5 mmol/L (ref 3.0–18)
BUN/Creatinine ratio: 24 — ABNORMAL HIGH (ref 12–20)
BUN: 13 MG/DL (ref 7.0–18)
Bilirubin, total: 0.5 MG/DL (ref 0.2–1.0)
CO2: 26 mmol/L (ref 21–32)
Calcium: 8.9 MG/DL (ref 8.5–10.1)
Chloride: 107 mmol/L (ref 100–111)
Creatinine: 0.54 MG/DL — ABNORMAL LOW (ref 0.6–1.3)
GFR est AA: >60 mL/min/{1.73_m2} (ref >60)
GFR est non-AA: >60 mL/min/{1.73_m2} (ref >60)
Globulin: 3.1 g/dL (ref 2.0–4.0)
Glucose: 133 mg/dL — ABNORMAL HIGH (ref 74–99)
Potassium: 3.7 mmol/L (ref 3.5–5.5)
Protein, total: 7.2 g/dL (ref 6.4–8.2)
Sodium: 138 mmol/L (ref 136–145)

## 2018-07-15 LAB — URINE MICROSCOPIC ONLY
RBC, UA: 0 /hpf (ref 0–5)
RBC: 0 /hpf (ref 0–5)
WBC, UA: 0 /hpf (ref 0–4)
WBC: 0 /hpf (ref 0–4)

## 2018-07-15 LAB — URINALYSIS W/ RFLX MICROSCOPIC
Bilirubin, Urine: NEGATIVE
Bilirubin: NEGATIVE
Blood, Urine: NEGATIVE
Blood: NEGATIVE
Glucose, Ur: NEGATIVE mg/dL
Glucose: NEGATIVE mg/dL
Ketone: NEGATIVE mg/dL
Ketones, Urine: NEGATIVE mg/dL
Nitrite, Urine: NEGATIVE
Nitrites: NEGATIVE
Protein, UA: NEGATIVE mg/dL
Protein: NEGATIVE mg/dL
Specific Gravity, UA: 1.009 (ref 1.005–1.030)
Specific gravity: 1.009 (ref 1.005–1.030)
Urobilinogen, UA, POCT: 0.2 EU/dL (ref 0.2–1.0)
Urobilinogen: 0.2 EU/dL (ref 0.2–1.0)
pH (UA): 6 (ref 5.0–8.0)
pH, UA: 6 (ref 5.0–8.0)

## 2018-07-15 LAB — CBC WITH AUTOMATED DIFF
ABS. BASOPHILS: 0 10*3/uL (ref 0.0–0.1)
ABS. EOSINOPHILS: 0 10*3/uL (ref 0.0–0.4)
ABS. LYMPHOCYTES: 1.1 10*3/uL (ref 0.9–3.6)
ABS. MONOCYTES: 0.2 10*3/uL (ref 0.05–1.2)
ABS. NEUTROPHILS: 4.1 10*3/uL (ref 1.8–8.0)
BASOPHILS: 0 % (ref 0–2)
EOSINOPHILS: 0 % (ref 0–5)
HCT: 39.9 % (ref 35.0–45.0)
HGB: 13 g/dL (ref 12.0–16.0)
LYMPHOCYTES: 21 % (ref 21–52)
MCH: 29.3 PG (ref 24.0–34.0)
MCHC: 32.6 g/dL (ref 31.0–37.0)
MCV: 90.1 FL (ref 74.0–97.0)
MONOCYTES: 4 % (ref 3–10)
MPV: 10.4 FL (ref 9.2–11.8)
NEUTROPHILS: 75 % — ABNORMAL HIGH (ref 40–73)
PLATELET: 249 10*3/uL (ref 135–420)
RBC: 4.43 M/uL (ref 4.20–5.30)
RDW: 12.2 % (ref 11.6–14.5)
WBC: 5.4 10*3/uL (ref 4.6–13.2)

## 2018-07-15 LAB — COMPREHENSIVE METABOLIC PANEL
ALT: 33 U/L (ref 13–56)
AST: 21 U/L (ref 10–38)
Albumin/Globulin Ratio: 1.3 (ref 0.8–1.7)
Albumin: 4.1 g/dL (ref 3.4–5.0)
Alkaline Phosphatase: 45 U/L (ref 45–117)
Anion Gap: 5 mmol/L (ref 3.0–18)
BUN: 13 MG/DL (ref 7.0–18)
Bun/Cre Ratio: 24 — ABNORMAL HIGH (ref 12–20)
CO2: 26 mmol/L (ref 21–32)
Calcium: 8.9 MG/DL (ref 8.5–10.1)
Chloride: 107 mmol/L (ref 100–111)
Creatinine: 0.54 MG/DL — ABNORMAL LOW (ref 0.6–1.3)
EGFR IF NonAfrican American: >60 mL/min/{1.73_m2} (ref >60)
GFR African American: >60 mL/min/{1.73_m2} (ref >60)
Globulin: 3.1 g/dL (ref 2.0–4.0)
Glucose: 133 mg/dL — ABNORMAL HIGH (ref 74–99)
Potassium: 3.7 mmol/L (ref 3.5–5.5)
Sodium: 138 mmol/L (ref 136–145)
Total Bilirubin: 0.5 MG/DL (ref 0.2–1.0)
Total Protein: 7.2 g/dL (ref 6.4–8.2)

## 2018-07-15 LAB — CBC WITH AUTO DIFFERENTIAL
Basophils %: 0 % (ref 0–2)
Basophils Absolute: 0 10*3/uL (ref 0.0–0.1)
Eosinophils %: 0 % (ref 0–5)
Eosinophils Absolute: 0 10*3/uL (ref 0.0–0.4)
Hematocrit: 39.9 % (ref 35.0–45.0)
Hemoglobin: 13 g/dL (ref 12.0–16.0)
Lymphocytes %: 21 % (ref 21–52)
Lymphocytes Absolute: 1.1 10*3/uL (ref 0.9–3.6)
MCH: 29.3 PG (ref 24.0–34.0)
MCHC: 32.6 g/dL (ref 31.0–37.0)
MCV: 90.1 FL (ref 74.0–97.0)
MPV: 10.4 FL (ref 9.2–11.8)
Monocytes %: 4 % (ref 3–10)
Monocytes Absolute: 0.2 10*3/uL (ref 0.05–1.2)
Neutrophils %: 75 % — ABNORMAL HIGH (ref 40–73)
Neutrophils Absolute: 4.1 10*3/uL (ref 1.8–8.0)
Platelets: 249 10*3/uL (ref 135–420)
RBC: 4.43 M/uL (ref 4.20–5.30)
RDW: 12.2 % (ref 11.6–14.5)
WBC: 5.4 10*3/uL (ref 4.6–13.2)

## 2018-07-15 MED ORDER — MECLIZINE 25 MG TAB
25 mg | ORAL_TABLET | Freq: Three times a day (TID) | ORAL | 0 refills | Status: AC | PRN
Start: 2018-07-15 — End: ?

## 2018-07-15 MED ORDER — ONDANSETRON (PF) 4 MG/2 ML INJECTION
4 mg/2 mL | INTRAMUSCULAR | Status: AC
Start: 2018-07-15 — End: 2018-07-15
  Administered 2018-07-15: 15:00:00 via INTRAVENOUS

## 2018-07-15 MED ORDER — ONDANSETRON HCL 4 MG TAB
4 mg | ORAL_TABLET | Freq: Three times a day (TID) | ORAL | 0 refills | Status: AC | PRN
Start: 2018-07-15 — End: ?

## 2018-07-15 MED ORDER — SODIUM CHLORIDE 0.9% BOLUS IV
0.9 % | Freq: Once | INTRAVENOUS | Status: AC
Start: 2018-07-15 — End: 2018-07-15
  Administered 2018-07-15: 15:00:00 via INTRAVENOUS

## 2018-07-15 MED ORDER — MECLIZINE 12.5 MG TAB
12.5 mg | ORAL | Status: AC
Start: 2018-07-15 — End: 2018-07-15
  Administered 2018-07-15: 15:00:00 via ORAL

## 2018-07-15 MED FILL — ONDANSETRON (PF) 4 MG/2 ML INJECTION: 4 mg/2 mL | INTRAMUSCULAR | Qty: 2

## 2018-07-15 MED FILL — MECLIZINE 12.5 MG TAB: 12.5 mg | ORAL | Qty: 2

## 2018-07-15 MED FILL — SODIUM CHLORIDE 0.9 % IV: INTRAVENOUS | Qty: 1000

## 2018-07-15 NOTE — ED Notes (Signed)
I have reviewed discharge instructions with the patient.  The patient verbalized understanding.  Patient armband removed and shredded

## 2018-07-15 NOTE — ED Provider Notes (Signed)
[facility]  EMERGENCY DEPARTMENT HISTORY AND PHYSICAL EXAM       Date: 07/15/2018   Patient Name: Rachel Cummings   Date of Birth: 1956-02-19  Medical Record Number: [id number]    HISTORY OF PRESENTING ILLNESS:     Rachel Cummings is a 63 y.o. female presenting with the noted PMH to the ED c/o dizziness.  Patient states she been having dizziness off and on for the last week.  She states it is worse when she moves her head.  Feels better when she holds her head still.  She denies any vision changes.  She reports some nausea but no vomiting.  Denies any chest pain or shortness of breath.  Denies abdominal pain.  Denies any urine or bowel changes.  Denies any recent travel or trauma.  She states she had some medication changes with her statin drugs.  No other medication changes.  Rest of 10 systems reviewed and negative.    Primary Care Provider: Other, Phys, MD   Specialist:    Past Medical History:   Past Medical History:   Diagnosis Date   ??? Hyperlipemia    ??? Vertigo         Past Surgical History:   No past surgical history on file.     Social History:   Social History     Tobacco Use   ??? Smoking status: Former Smoker   ??? Smokeless tobacco: Never Used   Substance Use Topics   ??? Alcohol use: Yes   ??? Drug use: Never        Allergies:   No Known Allergies     REVIEW OF SYSTEMS:  Review of Systems      PHYSICAL EXAM:  Vitals:    07/15/18 1001 07/15/18 1015 07/15/18 1030   BP: (!) 147/95 140/74 129/86   Pulse: 84 66 72   Resp: 18     Temp: 98.2 ??F (36.8 ??C)     SpO2: 98% 99% 98%   Weight: 60.3 kg (133 lb)     Height: 5\' 5"  (1.651 m)         Physical Exam   Vital signs reviewed.  Alert oriented x 3 in NAD.  HEENT: normocephalic atraumatic.    Eyes are PERRLA EOMI.  Conjunctiva normal.    External ears and nose normal.    Neck: normal external exam. No midline neck or back TTP.    Lungs are clear to ascultation bilaterally.  normal effort  Heart is regular rate and rhythm with no murmurs.     Abdomen soft and nontender.  No rebound rigidity or guarding.    Extremities: Moves all 4 extremities and no distress.  Full range of motion.  2+ pulses and BCR in all 4 extremities.  Neuro: Normal gait.  5 out of 5 strength in all 4 extremities. No facial droop.   Normal finger-nose.  No pronator drift. Normal speech.  Skin examination: intact. no rashes. No petechia or purpura.      Medications   sodium chloride 0.9 % bolus infusion 1,000 mL (1,000 mL IntraVENous New Bag 07/15/18 1052)   meclizine (ANTIVERT) tablet 25 mg (25 mg Oral Given 07/15/18 1058)   ondansetron (ZOFRAN) injection 4 mg (4 mg IntraVENous Given 07/15/18 1056)       RESULTS:    Labs -   Labs Reviewed   CBC WITH AUTOMATED DIFF - Abnormal; Notable for the following components:       Result Value  NEUTROPHILS 75 (*)     All other components within normal limits   METABOLIC PANEL, COMPREHENSIVE - Abnormal; Notable for the following components:    Glucose 133 (*)     Creatinine 0.54 (*)     BUN/Creatinine ratio 24 (*)     All other components within normal limits   URINALYSIS W/ RFLX MICROSCOPIC - Abnormal; Notable for the following components:    Leukocyte Esterase TRACE (*)     All other components within normal limits   URINE MICROSCOPIC ONLY - Abnormal; Notable for the following components:    Bacteria FEW (*)     All other components within normal limits       Radiologic Studies -  No results found.      MEDICAL DECISION MAKING    1159.  Patient reassessed.  Feeling better.  Has not eaten today yet.  Updated partial results.  1315.  Updated patient on results.  Will ambulate patient.  1341.  Patient reassessed.  Ambulated in no distress.  Steady gait.  Neurologically intact.  No evidence of stroke.  No evidence of meningitis.  No evidence of intracranial bleed or skull fracture.  No evidence of trauma.  Suspect vertigo.  Patient doing better medication here.  Will continue with medication at home.  Precautions explained.  Patient states  understanding and agrees with plan.  Discussed patient about risk benefits of doing head CT.  This point will just be risk of radiation with no benefit to patient.  However if any changes to come back at that point and reevaluate.  She states understanding.    Patient has no new complaints, changes, or physical findings.   Results were reviewed with the patient.  Pt's questions and concerns were addressed. Care plan was outlined, including follow-up with PCP/specialist and return precautions were discussed. Patient is felt to be stable for discharge at this time.       Diagnosis   Clinical Impression:   1. Vertigo           Follow-up Information     Follow up With Specialties Details Why Contact Info    [facility] Emergency Medicine Go in 2 days If symptoms worsen, As needed [address]  [place] [id number]  [phone]          Current Discharge Medication List      START taking these medications    Details   meclizine (ANTIVERT) 25 mg tablet Take 1 Tab by mouth three (3) times daily as needed for Dizziness for up to 20 doses.  Qty: 20 Tab, Refills: 0      ondansetron hcl (Zofran) 4 mg tablet Take 1 Tab by mouth every eight (8) hours as needed for Nausea for up to 10 doses.  Qty: 10 Tab, Refills: 0             Discharged in stable and improved condition.    This chart was completed using Dragon, a dictation transcription service. Errors may have resulted from using this device.

## 2018-07-15 NOTE — ED Notes (Signed)
Report from Lovely RN. Assume care of patient at this time.

## 2018-07-15 NOTE — ED Triage Notes (Signed)
Patient with increased dizziness over the last 1 1/2 weeks, HX: Vertigo

## 2018-07-15 NOTE — ED Notes (Signed)
Report from [facility]. Assume care of patient at this time.

## 2018-07-15 NOTE — ED Notes (Signed)
Patient with increased dizziness over the last 1 1/2 weeks, HX: Vertigo

## 2018-07-15 NOTE — ED Provider Notes (Signed)
[facility]  EMERGENCY DEPARTMENT HISTORY AND PHYSICAL EXAM       Date: 07/15/2018   Patient Name: Rachel Cummings   Date of Birth: 1955-05-09  Medical Record Number: [id number]    HISTORY OF PRESENTING ILLNESS:     Rachel Cummings is a 63 y.o. female presenting with the noted PMH to the ED c/o dizziness.  Patient states she been having dizziness off and on for the last week.  She states it is worse when she moves her head.  Feels better when she holds her head still.  She denies any vision changes.  She reports some nausea but no vomiting.  Denies any chest pain or shortness of breath.  Denies abdominal pain.  Denies any urine or bowel changes.  Denies any recent travel or trauma.  She states she had some medication changes with her statin drugs.  No other medication changes.  Rest of 10 systems reviewed and negative.    Primary Care Provider: Other, Phys, MD   Specialist:    Past Medical History:   Past Medical History:   Diagnosis Date   ??? Hyperlipemia    ??? Vertigo         Past Surgical History:   No past surgical history on file.     Social History:   Social History     Tobacco Use   ??? Smoking status: Former Smoker   ??? Smokeless tobacco: Never Used   Substance Use Topics   ??? Alcohol use: Yes   ??? Drug use: Never        Allergies:   No Known Allergies     REVIEW OF SYSTEMS:  Review of Systems      PHYSICAL EXAM:  Vitals:    07/15/18 1001 07/15/18 1015 07/15/18 1030   BP: (!) 147/95 140/74 129/86   Pulse: 84 66 72   Resp: 18     Temp: 98.2 ??F (36.8 ??C)     SpO2: 98% 99% 98%   Weight: 60.3 kg (133 lb)     Height: 5\' 5"  (1.651 m)         Physical Exam   Vital signs reviewed.  Alert oriented x 3 in NAD.  HEENT: normocephalic atraumatic.    Eyes are PERRLA EOMI.  Conjunctiva normal.    External ears and nose normal.    Neck: normal external exam. No midline neck or back TTP.    Lungs are clear to ascultation bilaterally.  normal effort  Heart is regular rate and rhythm with no murmurs.    Abdomen soft and  nontender.  No rebound rigidity or guarding.    Extremities: Moves all 4 extremities and no distress.  Full range of motion.  2+ pulses and BCR in all 4 extremities.  Neuro: Normal gait.  5 out of 5 strength in all 4 extremities. No facial droop.   Normal finger-nose.  No pronator drift. Normal speech.  Skin examination: intact. no rashes. No petechia or purpura.      Medications   sodium chloride 0.9 % bolus infusion 1,000 mL (1,000 mL IntraVENous New Bag 07/15/18 1052)   meclizine (ANTIVERT) tablet 25 mg (25 mg Oral Given 07/15/18 1058)   ondansetron (ZOFRAN) injection 4 mg (4 mg IntraVENous Given 07/15/18 1056)       RESULTS:    Labs -   Labs Reviewed   CBC WITH AUTOMATED DIFF - Abnormal; Notable for the following components:       Result Value  NEUTROPHILS 75 (*)     All other components within normal limits   METABOLIC PANEL, COMPREHENSIVE - Abnormal; Notable for the following components:    Glucose 133 (*)     Creatinine 0.54 (*)     BUN/Creatinine ratio 24 (*)     All other components within normal limits   URINALYSIS W/ RFLX MICROSCOPIC - Abnormal; Notable for the following components:    Leukocyte Esterase TRACE (*)     All other components within normal limits   URINE MICROSCOPIC ONLY - Abnormal; Notable for the following components:    Bacteria FEW (*)     All other components within normal limits       Radiologic Studies -  No results found.      MEDICAL DECISION MAKING    1159.  Patient reassessed.  Feeling better.  Has not eaten today yet.  Updated partial results.  1315.  Updated patient on results.  Will ambulate patient.  1341.  Patient reassessed.  Ambulated in no distress.  Steady gait.  Neurologically intact.  No evidence of stroke.  No evidence of meningitis.  No evidence of intracranial bleed or skull fracture.  No evidence of trauma.  Suspect vertigo.  Patient doing better medication here.  Will continue with medication at home.  Precautions explained.  Patient states understanding and agrees  with plan.  Discussed patient about risk benefits of doing head CT.  This point will just be risk of radiation with no benefit to patient.  However if any changes to come back at that point and reevaluate.  She states understanding.    Patient has no new complaints, changes, or physical findings.   Results were reviewed with the patient.  Pt's questions and concerns were addressed. Care plan was outlined, including follow-up with PCP/specialist and return precautions were discussed. Patient is felt to be stable for discharge at this time.       Diagnosis   Clinical Impression:   1. Vertigo           Follow-up Information     Follow up With Specialties Details Why Contact Info    [facility] Emergency Medicine Go in 2 days If symptoms worsen, As needed [address]  [place] [id number]  [phone]          Current Discharge Medication List      START taking these medications    Details   meclizine (ANTIVERT) 25 mg tablet Take 1 Tab by mouth three (3) times daily as needed for Dizziness for up to 20 doses.  Qty: 20 Tab, Refills: 0      ondansetron hcl (Zofran) 4 mg tablet Take 1 Tab by mouth every eight (8) hours as needed for Nausea for up to 10 doses.  Qty: 10 Tab, Refills: 0             Discharged in stable and improved condition.    This chart was completed using Dragon, a dictation transcription service. Errors may have resulted from using this device.

## 2018-07-15 NOTE — ED Notes (Signed)
I have reviewed discharge instructions with the patient.  The patient verbalized understanding.  Patient armband removed and shredded

## 2018-12-28 ENCOUNTER — Other Ambulatory Visit: Payer: Self-pay | Admitting: Internal Medicine

## 2018-12-28 DIAGNOSIS — Z1231 Encounter for screening mammogram for malignant neoplasm of breast: Secondary | ICD-10-CM

## 2019-02-09 ENCOUNTER — Ambulatory Visit
Admission: RE | Admit: 2019-02-09 | Discharge: 2019-02-09 | Disposition: A | Discharge status: Home / Self Care | Payer: 59 | Source: Ambulatory Visit | Attending: Internal Medicine | Admitting: Internal Medicine

## 2019-02-09 ENCOUNTER — Other Ambulatory Visit: Payer: Self-pay

## 2019-02-09 DIAGNOSIS — Z1231 Encounter for screening mammogram for malignant neoplasm of breast: Secondary | ICD-10-CM

## 2019-04-14 ENCOUNTER — Ambulatory Visit: Payer: PRIVATE HEALTH INSURANCE | Attending: Internal Medicine

## 2019-04-14 DIAGNOSIS — Z20822 Contact with and (suspected) exposure to covid-19: Secondary | ICD-10-CM

## 2019-04-15 LAB — NOVEL CORONAVIRUS, NAA: SARS-CoV-2, NAA: NOT DETECTED

## 2020-01-11 ENCOUNTER — Other Ambulatory Visit: Payer: Self-pay | Admitting: Internal Medicine

## 2020-01-11 DIAGNOSIS — Z1231 Encounter for screening mammogram for malignant neoplasm of breast: Secondary | ICD-10-CM

## 2020-03-06 ENCOUNTER — Ambulatory Visit
Admission: RE | Admit: 2020-03-06 | Discharge: 2020-03-06 | Disposition: A | Discharge status: Home / Self Care | Payer: Medicare Other | Source: Ambulatory Visit | Attending: Internal Medicine | Admitting: Internal Medicine

## 2020-03-06 ENCOUNTER — Other Ambulatory Visit: Payer: Self-pay

## 2020-03-06 DIAGNOSIS — Z1231 Encounter for screening mammogram for malignant neoplasm of breast: Secondary | ICD-10-CM

## 2020-04-15 ENCOUNTER — Other Ambulatory Visit: Payer: Medicare Other

## 2020-04-15 DIAGNOSIS — Z20822 Contact with and (suspected) exposure to covid-19: Secondary | ICD-10-CM

## 2020-04-16 LAB — NOVEL CORONAVIRUS, NAA: SARS-CoV-2, NAA: NOT DETECTED

## 2020-04-16 LAB — SARS-COV-2, NAA 2 DAY TAT

## 2021-01-31 ENCOUNTER — Other Ambulatory Visit: Payer: Self-pay | Admitting: Internal Medicine

## 2021-01-31 DIAGNOSIS — Z1231 Encounter for screening mammogram for malignant neoplasm of breast: Secondary | ICD-10-CM

## 2021-03-10 ENCOUNTER — Ambulatory Visit
Admission: RE | Admit: 2021-03-10 | Discharge: 2021-03-10 | Disposition: A | Discharge status: Home / Self Care | Payer: Medicare Other | Source: Ambulatory Visit | Attending: Internal Medicine | Admitting: Internal Medicine

## 2021-03-10 DIAGNOSIS — Z1231 Encounter for screening mammogram for malignant neoplasm of breast: Secondary | ICD-10-CM

## 2022-02-09 ENCOUNTER — Other Ambulatory Visit: Payer: Self-pay | Admitting: Internal Medicine

## 2022-02-09 DIAGNOSIS — Z1231 Encounter for screening mammogram for malignant neoplasm of breast: Secondary | ICD-10-CM

## 2022-04-03 ENCOUNTER — Ambulatory Visit
Admission: RE | Admit: 2022-04-03 | Discharge: 2022-04-03 | Disposition: A | Discharge status: Home / Self Care | Payer: Medicare Other | Source: Ambulatory Visit | Attending: Internal Medicine | Admitting: Internal Medicine

## 2022-04-03 DIAGNOSIS — Z1231 Encounter for screening mammogram for malignant neoplasm of breast: Secondary | ICD-10-CM

## 2022-10-20 IMAGING — MG MM DIGITAL SCREENING BILAT W/ TOMO AND CAD
6 of 10 series · 6 of 30 positions shown · non-contrast
Comparison: Previous exam(s).

CLINICAL DATA: Screening.

EXAM:
DIGITAL SCREENING BILATERAL MAMMOGRAM WITH TOMOSYNTHESIS AND CAD
TECHNIQUE: Bilateral screening digital craniocaudal and mediolateral oblique
mammograms were obtained. Bilateral screening digital breast
tomosynthesis was performed. The images were evaluated with
computer-aided detection.

[L CC synth-2D (1 of 2)]
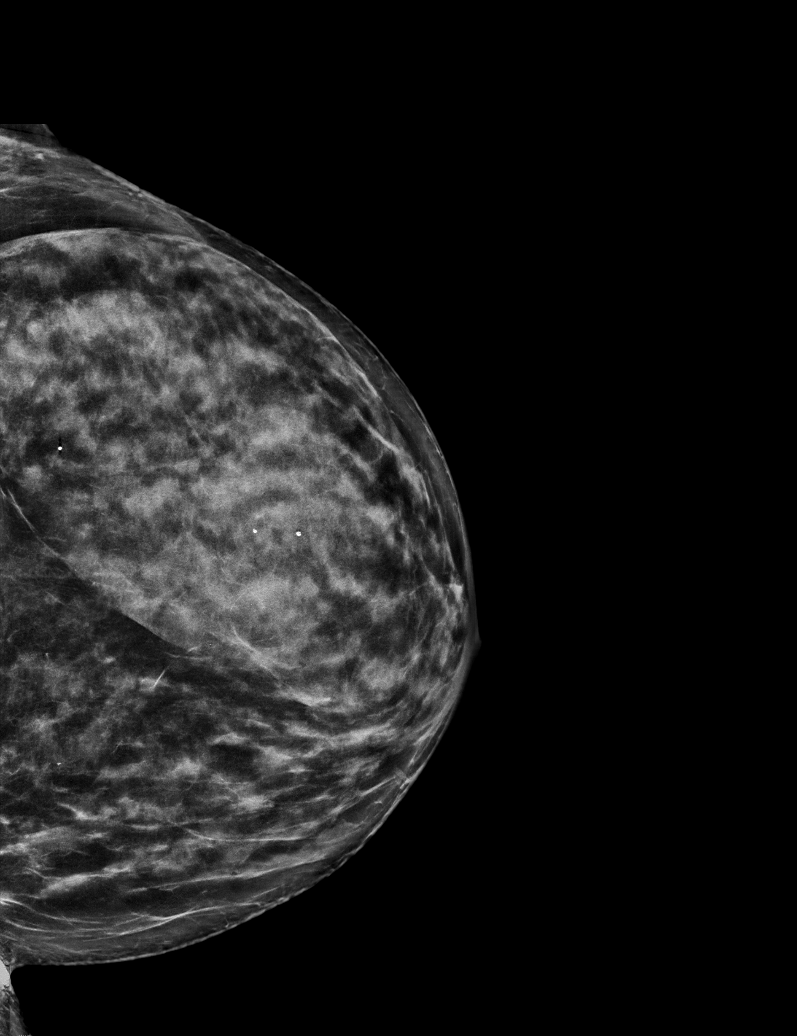

[R MLO synth-2D]
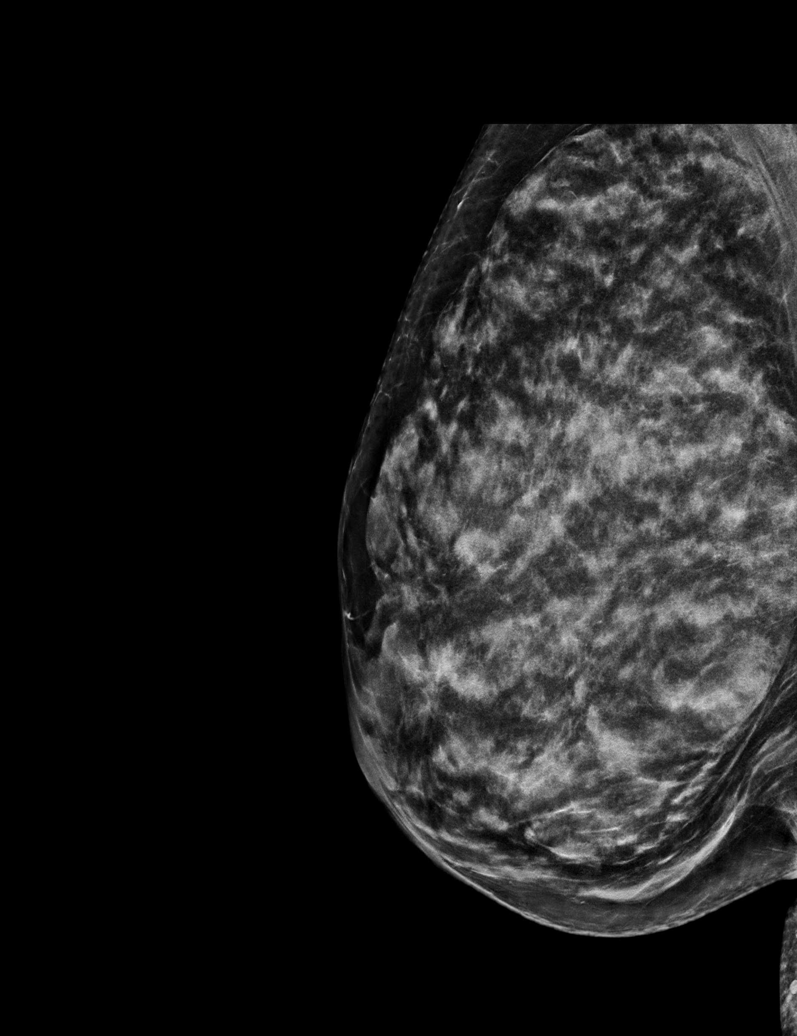

[L CC synth-2D (2 of 2)]
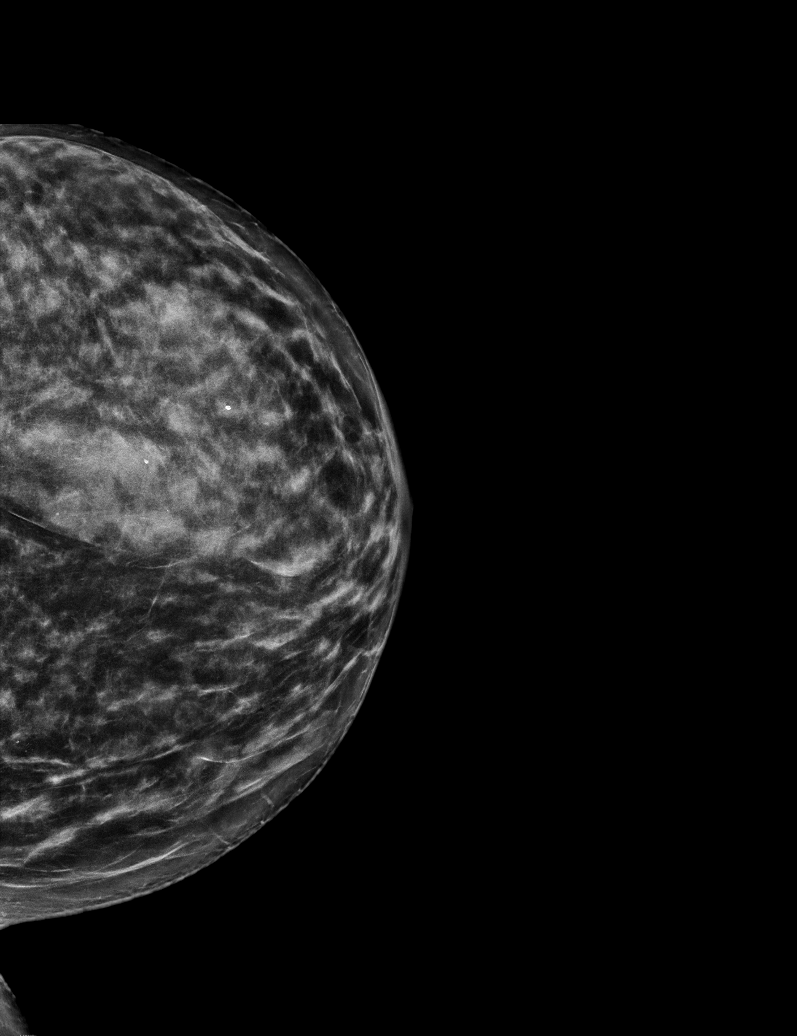

[L MLO synth-2D]
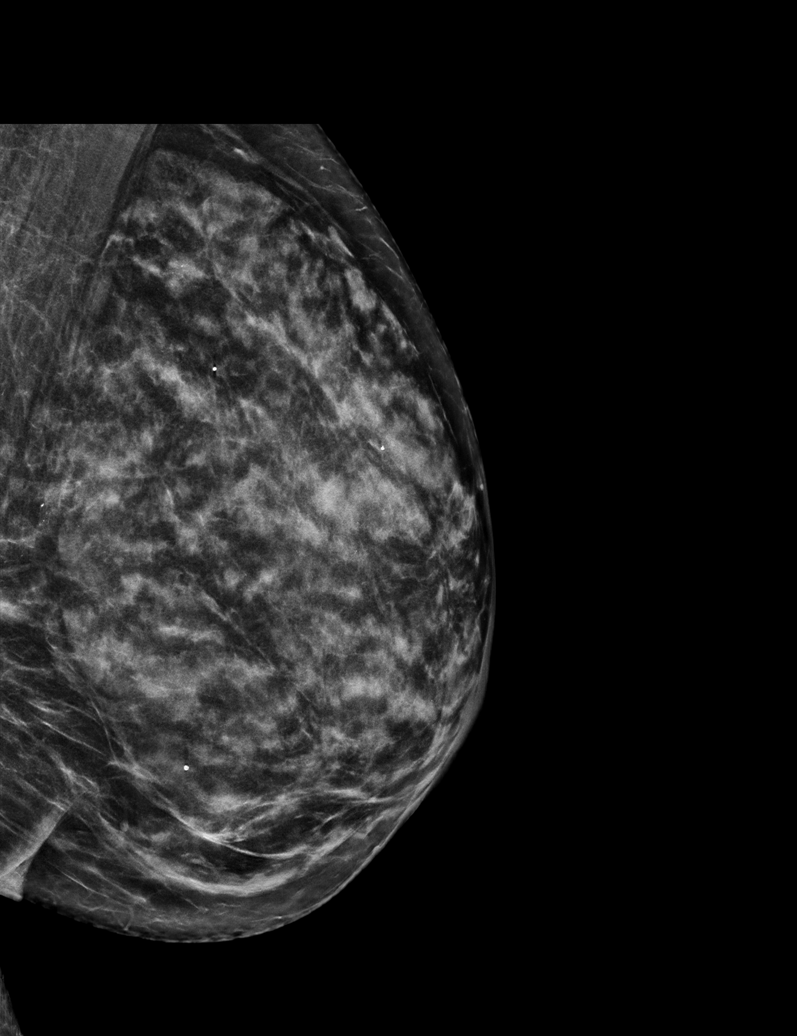

[R CC synth-2D]
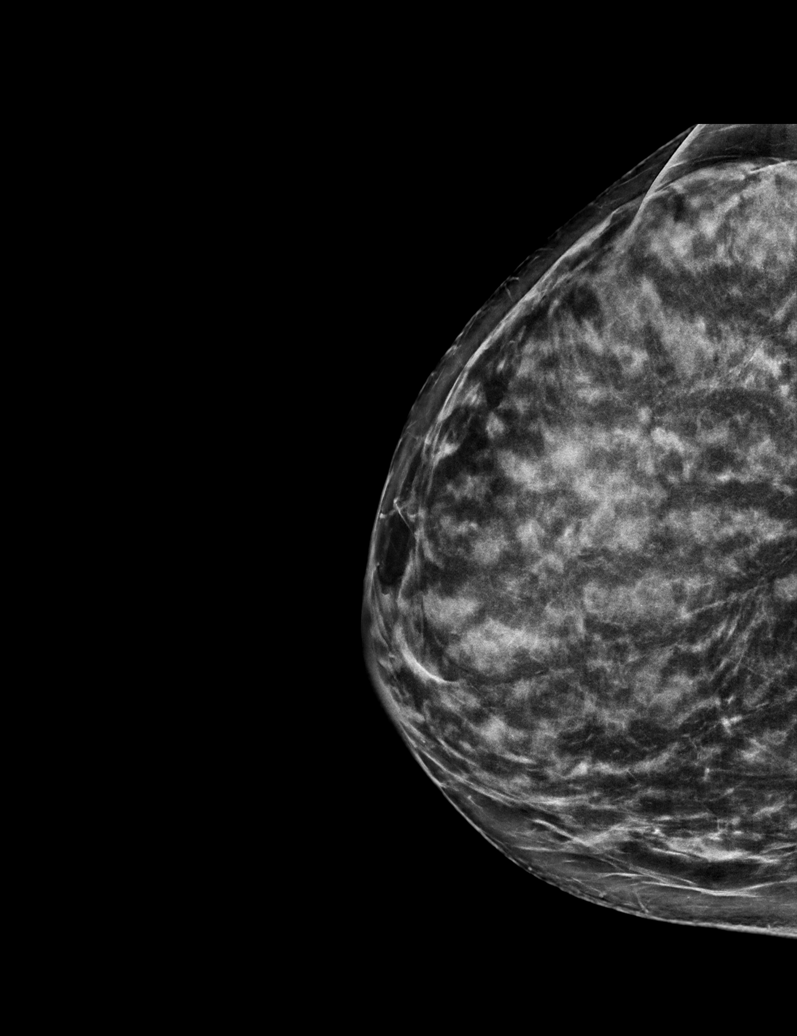

[L CC tomo · tomo slice 35/68.0]
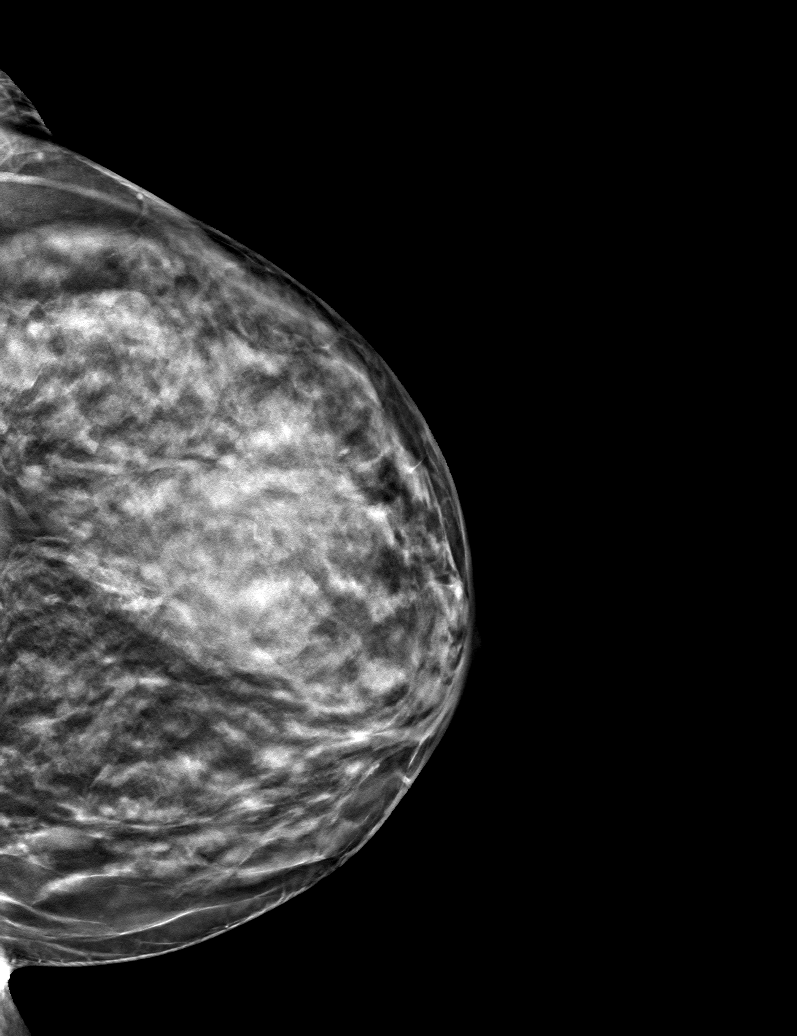

[6 of 30 positions shown; findings below may reference images not displayed]

ACR Breast Density Category d: The breast tissue is extremely dense,
which lowers the sensitivity of mammography
FINDINGS: There are no findings suspicious for malignancy.
IMPRESSION: No mammographic evidence of malignancy. A result letter of this
screening mammogram will be mailed directly to the patient.

RECOMMENDATION:
Screening mammogram in one year. (Code:TA-V-WV9)

BI-RADS CATEGORY  1: Negative.

## 2022-12-12 ENCOUNTER — Encounter: Payer: Self-pay | Admitting: Internal Medicine

## 2023-03-03 ENCOUNTER — Ambulatory Visit (AMBULATORY_SURGERY_CENTER): Payer: Medicare Other

## 2023-03-03 VITALS — Ht 66.0 in | Wt 135.0 lb

## 2023-03-03 DIAGNOSIS — Z1211 Encounter for screening for malignant neoplasm of colon: Secondary | ICD-10-CM

## 2023-03-03 MED ORDER — NA SULFATE-K SULFATE-MG SULF 17.5-3.13-1.6 GM/177ML PO SOLN: 1.0000 | Freq: Once | ORAL | 0 refills | Status: AC

## 2023-03-03 NOTE — Progress Notes (Signed)
No egg or soy allergy known to patient  No issues known to pt with past sedation with any surgeries or procedures Patient denies ever being told they had issues or difficulty with intubation  No FH of Malignant Hyperthermia Pt is not on diet pills Pt is not on  home 02  OSA  Pt is not on blood thinners  Pt denies issues with constipation  No A fib or A flutter Have any cardiac testing pending-- no  LOA: independent  Prep: suprep   Patient's chart reviewed by Cathlyn Parsons CNRA prior to previsit and patient appropriate for the [facility].  Previsit completed and red dot placed by patient's name on their procedure day (on provider's schedule).     PV competed with patient. Prep instructions sent via mychart and home address. Goodrx coupon for [facility] provided to use for price reduction if needed.

## 2023-03-08 ENCOUNTER — Other Ambulatory Visit: Payer: Self-pay | Admitting: Internal Medicine

## 2023-03-08 ENCOUNTER — Encounter: Payer: Medicare Other | Admitting: Internal Medicine

## 2023-03-08 DIAGNOSIS — Z1231 Encounter for screening mammogram for malignant neoplasm of breast: Secondary | ICD-10-CM

## 2023-03-29 ENCOUNTER — Encounter: Payer: Self-pay | Admitting: Internal Medicine

## 2023-03-30 ENCOUNTER — Encounter: Payer: Self-pay | Admitting: Internal Medicine

## 2023-03-30 ENCOUNTER — Ambulatory Visit (AMBULATORY_SURGERY_CENTER): Payer: Medicare Other | Admitting: Internal Medicine

## 2023-03-30 VITALS — BP 142/86 | HR 61 | Temp 97.5°F | Resp 14 | Ht 66.0 in | Wt 135.0 lb

## 2023-03-30 DIAGNOSIS — K5730 Diverticulosis of large intestine without perforation or abscess without bleeding: Secondary | ICD-10-CM

## 2023-03-30 DIAGNOSIS — D122 Benign neoplasm of ascending colon: Secondary | ICD-10-CM | POA: Diagnosis not present

## 2023-03-30 DIAGNOSIS — Z1211 Encounter for screening for malignant neoplasm of colon: Secondary | ICD-10-CM | POA: Diagnosis present

## 2023-03-30 DIAGNOSIS — K635 Polyp of colon: Secondary | ICD-10-CM | POA: Diagnosis not present

## 2023-03-30 MED ORDER — SODIUM CHLORIDE 0.9 % IV SOLN: 500.0000 mL | Freq: Once | INTRAVENOUS | Status: DC

## 2023-03-30 NOTE — Progress Notes (Signed)
 Pt's states no medical or surgical changes since previsit or office visit.

## 2023-03-30 NOTE — Progress Notes (Signed)
 To pacu, VSS. Report to RN.tb

## 2023-03-30 NOTE — Patient Instructions (Signed)
 Educational handout provided to patient related to Polyps, and Diverticulosis  Resume previous diet  Continue present medications  Awaiting pathology results   YOU HAD AN ENDOSCOPIC PROCEDURE TODAY AT THE [facility]:   Refer to the procedure report that was given to you for any specific questions about what was found during the examination.  If the procedure report does not answer your questions, please call your gastroenterologist to clarify.  If you requested that your care partner not be given the details of your procedure findings, then the procedure report has been included in a sealed envelope for you to review at your convenience later.  YOU SHOULD EXPECT: Some feelings of bloating in the abdomen. Passage of more gas than usual.  Walking can help get rid of the air that was put into your GI tract during the procedure and reduce the bloating. If you had a lower endoscopy (such as a colonoscopy or flexible sigmoidoscopy) you may notice spotting of blood in your stool or on the toilet paper. If you underwent a bowel prep for your procedure, you may not have a normal bowel movement for a few days.  Please Note:  You might notice some irritation and congestion in your nose or some drainage.  This is from the oxygen used during your procedure.  There is no need for concern and it should clear up in a day or so.  SYMPTOMS TO REPORT IMMEDIATELY:  Following lower endoscopy (colonoscopy or flexible sigmoidoscopy):  Excessive amounts of blood in the stool  Significant tenderness or worsening of abdominal pains  Swelling of the abdomen that is new, acute  Fever of 100F or higher  For urgent or emergent issues, a gastroenterologist can be reached at any hour by calling [phone]. Do not use MyChart messaging for urgent concerns.    DIET:  We do recommend a small meal at first, but then you may proceed to your regular diet.  Drink plenty of fluids but you should avoid  alcoholic beverages for 24 hours.  ACTIVITY:  You should plan to take it easy for the rest of today and you should NOT DRIVE or use heavy machinery until tomorrow (because of the sedation medicines used during the test).    FOLLOW UP: Our staff will call the number listed on your records the next business day following your procedure.  We will call around 7:15- 8:00 am to check on you and address any questions or concerns that you may have regarding the information given to you following your procedure. If we do not reach you, we will leave a message.     If any biopsies were taken you will be contacted by phone or by letter within the next 1-3 weeks.  Please call us at [phone] if you have not heard about the biopsies in 3 weeks.    SIGNATURES/CONFIDENTIALITY: You and/or your care partner have signed paperwork which will be entered into your electronic medical record.  These signatures attest to the fact that that the information above on your After Visit Summary has been reviewed and is understood.  Full responsibility of the confidentiality of this discharge information lies with you and/or your care-partner.

## 2023-03-30 NOTE — Op Note (Signed)
 [facility] Patient Name: Teresa Huff Procedure Date: 03/30/2023 8:00 AM MRN: [id number] Endoscopist: Norleen SAILOR. Abran , MD, [id number] Age: 68 Referring MD:  Date of Birth: [birth year] Gender: Female Account #: [id number] Procedure:                Colonoscopy with cold snare polypectomy x 2 Indications:              High risk colon cancer surveillance: Personal                            history of non-advanced adenoma. Grandparent with                            colon cancer. Previous examinations 1992, 1995,                            1999, 2004, 2009, 2014 Medicines:                Monitored Anesthesia Care Procedure:                Pre-Anesthesia Assessment:                           - Prior to the procedure, a History and Physical                            was performed, and patient medications and                            allergies were reviewed. The patient's tolerance of                            previous anesthesia was also reviewed. The risks                            and benefits of the procedure and the sedation                            options and risks were discussed with the patient.                            All questions were answered, and informed consent                            was obtained. Prior Anticoagulants: The patient has                            taken no anticoagulant or antiplatelet agents.                            After reviewing the risks and benefits, the patient                            was deemed in satisfactory condition to undergo the  procedure.                           After obtaining informed consent, the colonoscope                            was passed under direct vision. Throughout the                            procedure, the patient's blood pressure, pulse, and                            oxygen saturations were monitored continuously. The                            Olympus Scope [id number]  was introduced through the                            anus and advanced to the the cecum, identified by                            appendiceal orifice and ileocecal valve. The                            ileocecal valve, appendiceal orifice, and rectum                            were photographed. The quality of the bowel                            preparation was excellent. The colonoscopy was                            performed without difficulty. The patient tolerated                            the procedure well. The bowel preparation used was                            SUPREP via split dose instruction. Scope In: 8:27:29 AM Scope Out: 8:42:15 AM Scope Withdrawal Time: 0 hours 11 minutes 40 seconds  Total Procedure Duration: 0 hours 14 minutes 46 seconds  Findings:                 Two polyps were found in the ascending colon. The                            polyps were 1 to 2 mm in size. These polyps were                            removed with a cold snare. Resection and retrieval                            were complete.  Multiple diverticula were found in the left colon.                           The exam was otherwise without abnormality on                            direct and retroflexion views. Complications:            No immediate complications. Estimated blood loss:                            None. Estimated Blood Loss:     Estimated blood loss: none. Impression:               - Two 1 to 2 mm polyps in the ascending colon,                            removed with a cold snare. Resected and retrieved.                           - Diverticulosis in the left colon.                           - The examination was otherwise normal on direct                            and retroflexion views. Recommendation:           - Repeat colonoscopy in 7-10 years for surveillance.                           - Patient has a contact number available for                             emergencies. The signs and symptoms of potential                            delayed complications were discussed with the                            patient. Return to normal activities tomorrow.                            Written discharge instructions were provided to the                            patient.                           - Resume previous diet.                           - Continue present medications.                           - Await pathology results. Norleen SAILOR. Abran, MD 03/30/2023 9:00:52 AM This  report has been signed electronically.

## 2023-03-30 NOTE — Progress Notes (Signed)
 Called to room to assist during endoscopic procedure.  Patient ID and intended procedure confirmed with present staff. Received instructions for my participation in the procedure from the performing physician.

## 2023-03-30 NOTE — Progress Notes (Signed)
 HISTORY OF PRESENT ILLNESS:  Teresa Huff is a 68 y.o. female with personal history of adenomatous colon polyps and family history of colon cancer.  Now for surveillance colonoscopy  REVIEW OF SYSTEMS:  All non-GI ROS negative except for  Past Medical History:  Diagnosis Date   Hyperlipidemia    Sleep apnea     Past Surgical History:  Procedure Laterality Date   COLONOSCOPY     POLYPECTOMY     WISDOM TOOTH EXTRACTION      Social History Teresa Huff  reports that she has quit smoking. She has never used smokeless tobacco. She reports current alcohol use of about 14.0 standard drinks of alcohol per week. She reports that she does not use drugs.  family history includes Brain cancer in her father; Colon cancer in her paternal grandfather; Dementia in her mother; Heart disease in her mother.  No Known Allergies     PHYSICAL EXAMINATION: Vital signs: BP 138/74   Pulse 77   Temp (!) 97.5 F (36.4 C)   Resp 10   Ht 5' 6 (1.676 m)   Wt 135 lb (61.2 kg)   SpO2 98%   BMI 21.79 kg/m  General: Well-developed, well-nourished, no acute distress HEENT: Sclerae are anicteric, conjunctiva pink. Oral mucosa intact Lungs: Clear Heart: Regular Abdomen: soft, nontender, nondistended, no obvious ascites, no peritoneal signs, normal bowel sounds. No organomegaly. Extremities: No edema Psychiatric: alert and oriented x3. Cooperative     ASSESSMENT:  History of adenomatous polyps and family history of colon cancer   PLAN:  Surveillance colonoscopy

## 2023-03-31 ENCOUNTER — Telehealth: Payer: Self-pay | Admitting: *Deleted

## 2023-03-31 NOTE — Telephone Encounter (Signed)
  Follow up Call-     03/30/2023    7:20 AM  Call back number  Post procedure Call Back phone  # [phone]  Permission to leave phone message Yes     Patient questions:  Do you have a fever, pain , or abdominal swelling? No. Pain Score  0 *  Have you tolerated food without any problems? Yes.    Have you been able to return to your normal activities? Yes.    Do you have any questions about your discharge instructions: Diet   No. Medications  No. Follow up visit  No.  Do you have questions or concerns about your Care? No.  Actions: * If pain score is 4 or above: No action needed, pain <4.

## 2023-04-02 ENCOUNTER — Encounter: Payer: Self-pay | Admitting: Internal Medicine

## 2023-04-02 LAB — SURGICAL PATHOLOGY

## 2023-04-06 ENCOUNTER — Ambulatory Visit
Admission: RE | Admit: 2023-04-06 | Discharge: 2023-04-06 | Disposition: A | Discharge status: Home / Self Care | Payer: Medicare Other | Source: Ambulatory Visit | Attending: Internal Medicine | Admitting: Internal Medicine

## 2023-04-06 DIAGNOSIS — Z1231 Encounter for screening mammogram for malignant neoplasm of breast: Secondary | ICD-10-CM

## 2024-02-24 ENCOUNTER — Other Ambulatory Visit: Payer: Self-pay | Admitting: Internal Medicine

## 2024-02-24 DIAGNOSIS — Z1231 Encounter for screening mammogram for malignant neoplasm of breast: Secondary | ICD-10-CM

## 2024-02-25 ENCOUNTER — Ambulatory Visit: Admitting: Internal Medicine

## 2024-03-10 ENCOUNTER — Ambulatory Visit: Admitting: Internal Medicine

## 2024-04-18 ENCOUNTER — Ambulatory Visit

## 2024-04-21 ENCOUNTER — Ambulatory Visit: Attending: Internal Medicine | Admitting: Internal Medicine

## 2024-04-21 ENCOUNTER — Encounter: Payer: Self-pay | Admitting: Internal Medicine

## 2024-04-21 VITALS — BP 133/88 | HR 68 | Resp 17 | Ht 66.0 in | Wt 139.0 lb

## 2024-04-21 DIAGNOSIS — R000 Tachycardia, unspecified: Secondary | ICD-10-CM | POA: Diagnosis not present

## 2024-04-21 DIAGNOSIS — R55 Syncope and collapse: Secondary | ICD-10-CM

## 2024-04-21 NOTE — Patient Instructions (Addendum)
 Medication Instructions:  No changes  *If you need a refill on your cardiac medications before your next appointment, please call your pharmacy*   Lab Work: Not needed    Testing/Procedures:  Not  needed  Follow-Up: At [facility], you and your health needs are our priority.  As part of our continuing mission to provide you with exceptional heart care, we have created designated Provider Care Teams.  These Care Teams include your primary Cardiologist (physician) and Advanced Practice Providers (APPs -  Physician Assistants and Nurse Practitioners) who all work together to provide you with the care you need, when you need it.     Your next appointment:   As needed    The format for your next appointment:   In Person  Provider:   Vina Gull, MD

## 2024-04-21 NOTE — Progress Notes (Signed)
 "   Cardiology Office Note   Date:  04/21/2024   ID:  Teresa Huff, Teresa Huff 11-Feb-1956, MRN [id number]  PCP:  Henry Ingle, MD  Cardiologist:   Vina Gull, MD   Pt presents for evaluation of dizziness   History of Present Illness: Teresa Huff is a 69 y.o. female for evaluation of dizzines  She follows with Ingle Henry in medicine clinic  The pt says she has had intermittent dizziness for years    First started about 20 years ago   No syncope  She has a hard time remembering what she was doing at the time it occurred 20 years ago  Walking      Went and laid down  Resolved   Before COVID she started having dizziness again   Usually with bending to stand or when puts hed down and then up     In Dec 2025 she had a bad episode  Did not pass out   Felt not right for about 3 days      The pt has had a hx of vertigo in the past   This is different   When not having spells she feels good    DeniesCP   She is active   Exercises   No SOB     Mom had MI at 28   Was a smoker Father died of glioblastoma   He had HTN as did his sister       Active Medications[1]   Allergies:   Patient has no known allergies.   Past Medical History:  Diagnosis Date   Hyperlipidemia    Sleep apnea     Past Surgical History:  Procedure Laterality Date   COLONOSCOPY     POLYPECTOMY     WISDOM TOOTH EXTRACTION       Family History:  The patient's family history includes Brain cancer in her father; Colon cancer in her paternal grandfather; Dementia in her mother; Heart disease in her mother.    ROS:  Please see the history of present illness. All other systems are reviewed and  Negative to the above problem except as noted.    PHYSICAL EXAM: VS:  BP 133/88 (BP Location: Left Arm, Patient Position: Sitting, Cuff Size: Normal)   Pulse 68   Resp 17   Ht 5' 6 (1.676 m)   Wt 139 lb (63 kg)   SpO2 99%   BMI 22.44 kg/m    Orthostatic BP   Laying 120/83  P 99  Sietting 123/81  P 102    Standing 133/86  P 105  Standing 4 min 121/85  P 110 GEN: Well nourished, well developed, in no acute distress  HEENT: normal  Neck: no JVD, carotid bruits Cardiac: RRR; no murmurs Respiratory:  clear to auscultation bilaterally  GI: soft, nontender, nondistended, + BS  No hepatomegaly  Ext  No LE edema  2+ DP pulses    EKG:  EKG is ordered today.SR 90 bpm  Nonspecific ST changes   Lipid Panel No results found for: CHOL, TRIG, HDL, CHOLHDL, VLDL, LDLCALC, LDLDIRECT    Wt Readings from Last 3 Encounters:  04/21/24 139 lb (63 kg)  03/30/23 135 lb (61.2 kg)  03/03/23 135 lb (61.2 kg)      ASSESSMENT AND PLAN:  1  Dizziness  Hx of dizziness over several years    Fortunately no syncope    On orthostatic check today her HR increases 10 pts with standing  BP  stays the same Overall I think she has some tendency to orthostasis with changes in position.   Movement of head with with some vestibular dysfunction may also be contributing    No vertigo  I would recomm staying hydrated   Avoid positions that may trigger dizziness Get up more slowly If in one place for a long time move legs , consider compression hose or SPANX Increase salt intake to bump up BP a bit     2  Dyslipidemia     Pt has been on Crestor for years     Will get lipids from Dr Mardelle office   Follow up prn if symptoms worsen   Current medicines are reviewed at length with the patient today.  The patient does not have concerns regarding medicines.  Signed, Vina Gull, MD        [1]  Current Meds  Medication Sig   ALPRAZolam (XANAX) 0.5 MG tablet Take 0.5 mg by mouth daily.   bimatoprost (LATISSE) 0.03 % ophthalmic solution Place 1 Application into both eyes daily.   Cholecalciferol (VITAMIN D3) 50 MCG (2000 UT) CAPS Take by mouth.   doxylamine, Sleep, (UNISOM) 25 MG tablet Take 25 mg by mouth at bedtime as needed for sleep.   MAGNESIUM GLUCONATE PO Take 240 mg by mouth daily as needed.    rosuvastatin (CRESTOR) 10 MG tablet Take 10 mg by mouth daily.   "

## 2024-04-24 ENCOUNTER — Ambulatory Visit

## 2024-05-04 ENCOUNTER — Ambulatory Visit

## 2024-05-04 ENCOUNTER — Ambulatory Visit: Admitting: Internal Medicine

## 2024-05-11 ENCOUNTER — Ambulatory Visit: Admitting: Internal Medicine
